# Patient Record
Sex: Female | Born: 1990 | Hispanic: Yes | Marital: Single | Smoking: Never smoker
Health system: Southern US, Community
[De-identification: ages and names within clinical notes are randomized; demographics above are authoritative.]

## PROBLEM LIST (undated history)

## (undated) DIAGNOSIS — E079 Disorder of thyroid, unspecified: Secondary | ICD-10-CM

## (undated) HISTORY — PX: APPENDECTOMY: SHX54

---
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
## 2013-04-16 ENCOUNTER — Other Ambulatory Visit: Payer: Self-pay | Admitting: *Deleted

## 2013-04-22 ENCOUNTER — Other Ambulatory Visit: Payer: Medicaid Other

## 2013-04-24 ENCOUNTER — Ambulatory Visit: Payer: Medicaid Other | Admitting: Endocrinology

## 2013-05-09 ENCOUNTER — Other Ambulatory Visit: Payer: Self-pay | Admitting: Emergency Medicine

## 2013-05-09 DIAGNOSIS — R102 Pelvic and perineal pain: Secondary | ICD-10-CM

## 2013-05-10 ENCOUNTER — Ambulatory Visit
Admission: RE | Admit: 2013-05-10 | Discharge: 2013-05-10 | Disposition: A | Discharge status: Home / Self Care | Payer: BC Managed Care – PPO | Source: Ambulatory Visit | Attending: Emergency Medicine | Admitting: Emergency Medicine

## 2013-05-10 DIAGNOSIS — R102 Pelvic and perineal pain: Secondary | ICD-10-CM

## 2013-05-30 ENCOUNTER — Other Ambulatory Visit: Payer: Self-pay | Admitting: *Deleted

## 2013-05-31 ENCOUNTER — Telehealth: Payer: Self-pay | Admitting: Endocrinology

## 2013-05-31 NOTE — Telephone Encounter (Signed)
I let the patient know, she saw you at the old office and says she's seen you for about a year.  She has not filled out a medical release form yet. I will put one in the mail for her.

## 2013-05-31 NOTE — Telephone Encounter (Signed)
I do not see any medications from me, she should contact health Department or her primary care physician

## 2013-05-31 NOTE — Telephone Encounter (Signed)
Pt has not been seen here, she's requesting refills of her medicine which I denied, she said she can't afford an OV and wants to know what to do about her meds, please advise?

## 2014-09-29 ENCOUNTER — Ambulatory Visit (INDEPENDENT_AMBULATORY_CARE_PROVIDER_SITE_OTHER): Payer: Managed Care, Other (non HMO) | Admitting: Family Medicine

## 2014-09-29 VITALS — BP 102/62 | HR 83 | Temp 98.3°F | Resp 16 | Ht 62.0 in | Wt 126.0 lb

## 2014-09-29 DIAGNOSIS — M778 Other enthesopathies, not elsewhere classified: Secondary | ICD-10-CM

## 2014-09-29 DIAGNOSIS — N920 Excessive and frequent menstruation with regular cycle: Secondary | ICD-10-CM

## 2014-09-29 DIAGNOSIS — N898 Other specified noninflammatory disorders of vagina: Secondary | ICD-10-CM

## 2014-09-29 MED ORDER — NORETHINDRONE ACET-ETHINYL EST 1-20 MG-MCG PO TABS: 1.0000 | ORAL_TABLET | Freq: Every day | ORAL | Status: DC

## 2014-09-29 MED ORDER — PREDNISONE 20 MG PO TABS: 40.0000 mg | ORAL_TABLET | Freq: Every day | ORAL | Status: DC

## 2014-09-29 NOTE — Progress Notes (Signed)
24 yo woman who is right handed and works at [employer]. Over the last month, patient's developed dorsal wrist pain on the right. She's bought a splint but that hasn't helped much. She's also tried ibuprofen which is also not helping.  The pain is mainly in the middle of the dorsal wrist. She does have some soreness on the radial styloid.   She also has persistent spotting for two months. She's currently taking Depo-Medrol shots in her most recent was this month. She started bleeding in the early part of December. She's had the same partner.  Patient denies any cramps or heavy flow.  Objective: Patient has full range of motion of the right wrist. There is no bony mallet ear swelling. She does have some tenderness in the dorsal mid middle aspect of the right wrist with full range of motion but tenderness with forced volar flexion.  This chart was scribed in my presence and reviewed by me personally.    ICD-9-CM ICD-10-CM   1. Right wrist tendonitis 727.05 M77.8 predniSONE (DELTASONE) 20 MG tablet  2. Spotting 623.8 N89.8 GC/chlamydia probe amp, urine     norethindrone-ethinyl estradiol (LOESTRIN 1/20, 21,) 1-20 MG-MCG tablet     Signed, Elvina SidleKurt Emmerich Cryer, MD

## 2014-10-01 LAB — GC/CHLAMYDIA PROBE AMP, URINE
Chlamydia, Swab/Urine, PCR: NEGATIVE
GC Probe Amp, Urine: NEGATIVE

## 2015-05-19 ENCOUNTER — Ambulatory Visit (INDEPENDENT_AMBULATORY_CARE_PROVIDER_SITE_OTHER): Payer: Managed Care, Other (non HMO) | Admitting: Urgent Care

## 2015-05-19 VITALS — BP 116/68 | Temp 98.1°F | Resp 17 | Ht 61.0 in | Wt 125.0 lb

## 2015-05-19 DIAGNOSIS — B373 Candidiasis of vulva and vagina: Secondary | ICD-10-CM | POA: Diagnosis not present

## 2015-05-19 DIAGNOSIS — L298 Other pruritus: Secondary | ICD-10-CM | POA: Diagnosis not present

## 2015-05-19 DIAGNOSIS — B3731 Acute candidiasis of vulva and vagina: Secondary | ICD-10-CM | POA: Insufficient documentation

## 2015-05-19 DIAGNOSIS — R300 Dysuria: Secondary | ICD-10-CM

## 2015-05-19 DIAGNOSIS — N898 Other specified noninflammatory disorders of vagina: Secondary | ICD-10-CM

## 2015-05-19 LAB — POCT URINALYSIS DIPSTICK
BILIRUBIN UA: NEGATIVE
GLUCOSE UA: NEGATIVE
Ketones, UA: NEGATIVE
NITRITE UA: NEGATIVE
Protein, UA: NEGATIVE
RBC UA: NEGATIVE
Spec Grav, UA: 1.015
UROBILINOGEN UA: 0.2
pH, UA: 7

## 2015-05-19 LAB — POCT WET PREP WITH KOH
KOH Prep POC: POSITIVE
TRICHOMONAS UA: NEGATIVE
Yeast Wet Prep HPF POC: POSITIVE

## 2015-05-19 LAB — POCT UA - MICROSCOPIC ONLY
BACTERIA, U MICROSCOPIC: NEGATIVE
CASTS, UR, LPF, POC: NEGATIVE
Crystals, Ur, HPF, POC: NEGATIVE
MUCUS UA: NEGATIVE
Yeast, UA: NEGATIVE

## 2015-05-19 LAB — POCT URINE PREGNANCY: PREG TEST UR: NEGATIVE

## 2015-05-19 MED ORDER — FLUCONAZOLE 150 MG PO TABS: 150.0000 mg | ORAL_TABLET | Freq: Once | ORAL | Status: DC

## 2015-05-19 NOTE — Patient Instructions (Signed)

## 2015-05-19 NOTE — Progress Notes (Signed)
MRN: [id number] DOB: 10/07/1990  Subjective:   Belinda Snyder is a 24 y.o. female presenting for chief complaint of Dysuria and vaginal itching  Reports 1.5 week history of painful urination, malodorous urine, vaginal irritation, genital rash, yellow vaginal discharge. Patient admits that she does have more genital itching after intercourse with use of latex condoms. She did go to a [facility] mobile clinic on 05/16/2015, had STI testing completed but results are not available until Friday 05/22/2015. Has also been treated in the past for BV. Has tried Vagisil wipes with minimal relief. Denies fever, pelvic pain, n/v, abdominal pain, hematuria, urinary frequency. Denies any other aggravating or relieving factors, no other questions or concerns.  Belinda Snyder has a current medication list which includes the following prescription(s): etonogestrel. Also has No Known Allergies.  Belinda Snyder  has no past medical history on file. Also  has past surgical history that includes Appendectomy.  Objective:   Vitals: BP 116/68 mmHg  Temp(Src) 98.1 F (36.7 C) (Oral)  Resp 17  Ht 5\' 1"  (1.549 m)  Wt 125 lb (56.7 kg)  BMI 23.63 kg/m2  LMP 04/10/2015  Physical Exam  Constitutional: She is oriented to person, place, and time. She appears well-developed and well-nourished.  Cardiovascular: Normal rate.   Pulmonary/Chest: Effort normal.  Genitourinary: No labial fusion. There is no rash, tenderness, lesion or injury on the right labia. There is no rash, tenderness, lesion or injury on the left labia.  Neurological: She is alert and oriented to person, place, and time.  Skin: Skin is warm and dry. No rash noted. No erythema. No pallor.  Psychiatric: She has a normal mood and affect.   Results for orders placed or performed in visit on 05/19/15 (from the past 24 hour(s))  POCT UA - Microscopic Only     Status: None   Collection Time: 05/19/15  7:57 PM  Result Value Ref Range   WBC, Ur,  HPF, POC 6-10    RBC, urine, microscopic 0-2    Bacteria, U Microscopic neg    Mucus, UA neg    Epithelial cells, urine per micros 2-5    Crystals, Ur, HPF, POC neg    Casts, Ur, LPF, POC neg    Yeast, UA neg   POCT urinalysis dipstick     Status: Abnormal   Collection Time: 05/19/15  7:57 PM  Result Value Ref Range   Color, UA yellow    Clarity, UA clear    Glucose, UA neg    Bilirubin, UA neg    Ketones, UA neg    Spec Grav, UA 1.015    Blood, UA neg    pH, UA 7.0    Protein, UA neg    Urobilinogen, UA 0.2    Nitrite, UA neg    Leukocytes, UA large (3+) (A) Negative  POCT Wet Prep with KOH     Status: None   Collection Time: 05/19/15  8:24 PM  Result Value Ref Range   Trichomonas, UA Negative    Clue Cells Wet Prep HPF POC 0-2    Epithelial Wet Prep HPF POC Many Few, Moderate, Many   Yeast Wet Prep HPF POC positive    Bacteria Wet Prep HPF POC Few None, Few   RBC Wet Prep HPF POC 0-3    WBC Wet Prep HPF POC 2-4    KOH Prep POC Positive   POCT urine pregnancy     Status: None   Collection Time: 05/19/15  8:25 PM  Result Value Ref Range   Preg Test, Ur Negative Negative   Assessment and Plan :   1. Dysuria 2. Vaginal itching 3. Vaginal discharge 4. Yeast vaginitis - Start diflucan for her yeast vaginitis. Patient states that she tested negative for HIV recently. I recommended that she make sure she follow up with her other is to have results on Friday with the [facility] mobile clinic. She is to followup in one week if no improvement in her symptoms.  Wallis Bamberg, PA-C [facility] [phone] 05/19/2015 7:49 PM

## 2015-06-29 ENCOUNTER — Ambulatory Visit (INDEPENDENT_AMBULATORY_CARE_PROVIDER_SITE_OTHER): Payer: Managed Care, Other (non HMO) | Admitting: Internal Medicine

## 2015-06-29 VITALS — BP 100/68 | HR 73 | Temp 98.1°F | Resp 16 | Ht 61.0 in | Wt 125.8 lb

## 2015-06-29 DIAGNOSIS — I862 Pelvic varices: Secondary | ICD-10-CM | POA: Diagnosis not present

## 2015-06-29 DIAGNOSIS — N898 Other specified noninflammatory disorders of vagina: Secondary | ICD-10-CM | POA: Diagnosis not present

## 2015-06-29 DIAGNOSIS — N941 Unspecified dyspareunia: Secondary | ICD-10-CM

## 2015-06-29 DIAGNOSIS — R1084 Generalized abdominal pain: Secondary | ICD-10-CM | POA: Diagnosis not present

## 2015-06-29 LAB — POCT URINALYSIS DIP (MANUAL ENTRY)
BILIRUBIN UA: NEGATIVE
Blood, UA: NEGATIVE
Glucose, UA: NEGATIVE
LEUKOCYTES UA: NEGATIVE
Nitrite, UA: NEGATIVE
PH UA: 5
Protein Ur, POC: NEGATIVE
SPEC GRAV UA: 1.015
Urobilinogen, UA: 0.2

## 2015-06-29 LAB — POC MICROSCOPIC URINALYSIS (UMFC): Mucus: ABSENT

## 2015-06-29 LAB — POCT WET + KOH PREP
TRICH BY WET PREP: ABSENT
Yeast by KOH: ABSENT
Yeast by wet prep: ABSENT

## 2015-06-29 MED ORDER — FLUCONAZOLE 150 MG PO TABS: 150.0000 mg | ORAL_TABLET | Freq: Once | ORAL | Status: DC

## 2015-06-29 MED ORDER — METRONIDAZOLE 500 MG PO TABS: 500.0000 mg | ORAL_TABLET | Freq: Two times a day (BID) | ORAL | Status: DC

## 2015-06-29 NOTE — Progress Notes (Addendum)
Subjective:  This chart was scribed for Ellamae Sia, MD by [facility], medical scribe at [facility].The patient was seen in exam room 14 and the patient's care was started at 8:38 PM.   Patient ID: Belinda Snyder, female    DOB: 01/24/1991, 24 y.o.   MRN: [id number] Chief Complaint  Patient presents with  . Follow-up    vaginal discharge with odor  . Abdominal Pain    HPI HPI Comments: Belinda Snyder is a 24 y.o. female who presents to [facility] complaining of pelvic pain, vaginal odor and discharge. No dysuria, and urinary frequency. Having pain with intercourse, described as deep in the abdomen. Some dryness during intercourse. On nexplanon, She did have a menstrual period this past month. In a 5 year relationship though this is not continuous and has been on and off for the past 9 month. There may of been outside partners. Tested for gonorrhea and chlamydia, which was negative early September 2016 reportedly/this is not in the chart.   Has issues with constipation. No nausea or vomiting.   Her pelvic discomfort is actually intermittent over the last year or more as is her dyspareunia. All the important note is her pelvic ultrasound results indicating ovarian varices. This has not been investigated further.  History reviewed. No pertinent past medical history. Prior to Admission medications   Medication Sig Start Date End Date Taking? Authorizing Provider  etonogestrel (NEXPLANON) 68 MG IMPL implant 1 each by Subdermal route once.   Yes Historical Provider, MD  fluconazole (DIFLUCAN) 150 MG tablet Take 1 tablet (150 mg total) by mouth once. Repeat 72 hours later. Patient not taking: Reported on 06/29/2015 05/19/15   Wallis Bamberg, PA-C   No Known Allergies   Review of Systems  Genitourinary: Positive for vaginal discharge and pelvic pain. Negative for dysuria, frequency and vaginal bleeding.  chronically  constipated but rare pain     Objective:  BP 100/68 mmHg  Pulse 73  Temp(Src) 98.1 F (36.7 C) (Oral)  Resp 16  Ht  (1.549 m)  Wt 125 lb 12.8 oz (57.063 kg)  BMI 23.78 kg/m2  SpO2 98%  LMP 06/10/2015 Physical Exam  Constitutional: She is oriented to person, place, and time. She appears well-developed and well-nourished. No distress.  HENT:  Head: Normocephalic and atraumatic.  Eyes: Pupils are equal, round, and reactive to light.  Neck: Normal range of motion.  Cardiovascular: Normal rate and regular rhythm.   Pulmonary/Chest: Effort normal. No respiratory distress.  Abdominal:  Mild tenderness in right and left lower quadrants without suprapubic tenderness. No masses. No rebound or guarding  Genitourinary:  The introitus is clear without inguinal adenopathy Vault is clear except white discharge Os clear without friability Uterus mid position and nontender No adnexal masses or tenderness Palpable stool in the colon that is tender  Musculoskeletal: Normal range of motion.  Neurological: She is alert and oriented to person, place, and time.  Skin: Skin is warm and dry.  Psychiatric: She has a normal mood and affect. Her behavior is normal.  Nursing note and vitals reviewed. BP 100/68 mmHg  Pulse 73  Temp(Src) 98.1 F (36.7 C) (Oral)  Resp 16  Ht  (1.549 m)  Wt 125 lb 12.8 oz (57.063 kg)  BMI 23.78 kg/m2  SpO2 98%  LMP 06/10/2015  Results for orders placed or performed in visit on 06/29/15  POCT urinalysis dipstick  Result Value Ref Range   Color,  UA yellow yellow   Clarity, UA hazy (A) clear   Glucose, UA negative negative   Bilirubin, UA negative negative   Ketones, POC UA small (15) (A) negative   Spec Grav, UA 1.015    Blood, UA negative negative   pH, UA 5.0    Protein Ur, POC negative negative   Urobilinogen, UA 0.2    Nitrite, UA Negative Negative   Leukocytes, UA Negative Negative  POCT Microscopic Urinalysis ([facility])  Result Value Ref Range    WBC,UR,HPF,POC Few (A) None WBC/hpf   RBC,UR,HPF,POC None None RBC/hpf   Bacteria Few (A) None, Too numerous to count   Mucus Absent Absent   Epithelial Cells, UR Per Microscopy Few (A) None, Too numerous to count cells/hpf  POCT Wet + KOH Prep  Result Value Ref Range   Yeast by KOH Absent Present, Absent   Yeast by wet prep Absent Present, Absent   WBC by wet prep Few None, Few, Too numerous to count   Clue Cells Wet Prep HPF POC Moderate (A) None, Too numerous to count   Trich by wet prep Absent Present, Absent   Bacteria Wet Prep HPF POC Many (A) None, Few, Too numerous to count   Epithelial Cells By [employer]et Pref ([facility]) Many (A) None, Few, Too numerous to count   RBC,UR,HPF,POC None None RBC/hpf       Assessment & Plan:  Vaginal discharge -secondary to bacterial vaginosis   Lower abdominal pain - ? Secondary to constipation versus #3  Pelvic varices - Plan: Ambulatory referral to Gynecology for follow-up evaluation of pelvic ultrasound  Dyspareunia in female - Plan: Ambulatory referral to Gynecology  Meds ordered this encounter  Medications  . fluconazole (DIFLUCAN) 150 MG tablet if needed following Flagyl     Sig: Take 1 tablet (150 mg total) by mouth once.    Dispense:  1 tablet    Refill:  0  . metroNIDAZOLE (FLAGYL) 500 MG tablet    Sig: Take 1 tablet (500 mg total) by mouth 2 (two) times daily.    Dispense:  14 tablet    Refill:  0   Repeated Gen-Probe   By signing my name below, I, Nadim Abuhashem, attest that this documentation has been prepared under the direction and in the presence of Ellamae Siaobert Robi Mitter, MD.  Electronically Signed: Conchita ParisNadim Abuhashem, medical scribe. 06/29/2015, 8:45 PM.    Addend 10/25 miralax twice a day til soft to remove any possibility that pain is intestinal

## 2015-06-30 ENCOUNTER — Telehealth: Payer: Self-pay

## 2015-06-30 MED ORDER — POLYETHYLENE GLYCOL 3350 17 GM/SCOOP PO POWD: 17.0000 g | Freq: Two times a day (BID) | ORAL | Status: DC | PRN

## 2015-06-30 NOTE — Telephone Encounter (Signed)
Meds ordered this encounter  Medications  . polyethylene glycol powder (GLYCOLAX/MIRALAX) powder    Sig: Take 17 g by mouth 2 (two) times daily as needed. Until stools soft and constipation resolved    Dispense:  225 g    Refill:  1

## 2015-06-30 NOTE — Telephone Encounter (Signed)
Patient Returned call - relayed message, she will go by the pharmacy tonight after work.  Thank you so much.

## 2015-06-30 NOTE — Telephone Encounter (Signed)
Dr. Merla Richesoolittle did you want to Rx something for stomach?

## 2015-06-30 NOTE — Telephone Encounter (Signed)
Called pt, unable to leave message.

## 2015-06-30 NOTE — Telephone Encounter (Signed)
Pt is needing to get the rx regarding her stomach redone -they state they did not get that last night

## 2015-07-01 LAB — GC/CHLAMYDIA PROBE AMP
CT PROBE, AMP APTIMA: NEGATIVE
GC Probe RNA: NEGATIVE

## 2015-08-28 ENCOUNTER — Ambulatory Visit (INDEPENDENT_AMBULATORY_CARE_PROVIDER_SITE_OTHER): Payer: Managed Care, Other (non HMO) | Admitting: Family Medicine

## 2015-08-28 VITALS — BP 116/68 | HR 76 | Temp 98.5°F | Resp 17 | Ht 61.0 in | Wt 124.0 lb

## 2015-08-28 DIAGNOSIS — N898 Other specified noninflammatory disorders of vagina: Secondary | ICD-10-CM | POA: Diagnosis not present

## 2015-08-28 DIAGNOSIS — B9689 Other specified bacterial agents as the cause of diseases classified elsewhere: Secondary | ICD-10-CM

## 2015-08-28 DIAGNOSIS — N760 Acute vaginitis: Secondary | ICD-10-CM | POA: Diagnosis not present

## 2015-08-28 DIAGNOSIS — A499 Bacterial infection, unspecified: Secondary | ICD-10-CM

## 2015-08-28 DIAGNOSIS — N921 Excessive and frequent menstruation with irregular cycle: Secondary | ICD-10-CM | POA: Diagnosis not present

## 2015-08-28 LAB — POCT WET + KOH PREP
TRICH BY WET PREP: ABSENT
YEAST BY KOH: ABSENT
YEAST BY WET PREP: ABSENT

## 2015-08-28 MED ORDER — LEVONORGESTREL-ETHINYL ESTRAD 0.1-20 MG-MCG PO TABS: 1.0000 | ORAL_TABLET | Freq: Every day | ORAL | Status: DC

## 2015-08-28 MED ORDER — TINIDAZOLE 500 MG PO TABS: 2.0000 g | ORAL_TABLET | Freq: Every day | ORAL | Status: DC

## 2015-08-28 MED ORDER — FLUCONAZOLE 150 MG PO TABS: 150.0000 mg | ORAL_TABLET | Freq: Once | ORAL | Status: DC

## 2015-08-28 NOTE — Patient Instructions (Signed)

## 2015-08-28 NOTE — Progress Notes (Addendum)
Subjective:  By signing my name below, I, Belinda Snyder, attest that this documentation has been prepared under the direction and in the presence of Belinda SorensonEva Shaw, MD.  Broadus Johnawaa Al Snyder, Medical Scribe. 08/28/2015.  4:15 PM.    Patient ID: Belinda Snyder, female    DOB: 03/28/1991, 24 y.o.   MRN: [id number]  Chief Complaint  Patient presents with  . Vaginal Discharge   HPI HPI Comments: Belinda Snyder is a 24 y.o. female who presents to [facility] complaining of abnormal vaginal discharge.  Pt is on Nexplanon. She had frequent episode of vaginitis. She was seen 3 months ago, and then again 2 months ago with bacterial vaginosis, and yeast infection. She was referred to gynecology, made an appointment [facility] last month. Pt has followed up with them, and indicates that she was put on birth control pills in addition to her implant. She states that she stopped taking the pills after a month however due to her insurance stopped paying for it.   Pt reports that she is presenting with similar symptoms as her previous BV. She states that her symptoms have been intermittent during the past 3 months. She reports symptoms of abnormal odor. She denies increased frequency that is different than baseline, having increased urgency at night time, or any urinary or bowel complications. Pt is sexually active, and is not concerned about being pregnant.   Pt indicates that she has been menstruating intermittently for the past 3 weeks. She notes having associated symptoms of headaches and fatigue. She states that she leads a healthy lifestyle with health diet and exercising regularly.   Patient Active Problem List   Diagnosis Date Noted  . 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 Yeast vaginitis 05/19/2015   No past medical history on file. Past Surgical History  Procedure Laterality Date  . Appendectomy     No Known Allergies Prior to Admission medications     Medication Sig Start Date End Date Taking? Authorizing Provider  etonogestrel (NEXPLANON) 68 MG IMPL implant 1 each by Subdermal route once.   Yes Historical Provider, MD   Social History   Social History  . Marital Status: Single    Spouse Name: N/A  . Number of Children: N/A  . Years of Education: N/A   Occupational History  . Not on file.   Social History Main Topics  . Smoking status: Never Smoker   . Smokeless tobacco: Not on file  . Alcohol Use: Yes     Comment: occassinally  . Drug Use: No  . Sexual Activity: Not on file   Other Topics Concern  . Not on file   Social History Narrative    Review of Systems  Constitutional: Positive for fatigue.  Gastrointestinal: Negative for diarrhea and constipation.  Genitourinary: Positive for vaginal discharge and menstrual problem. Negative for frequency.  Neurological: Positive for headaches.      Objective:   Physical Exam  Constitutional: She is oriented to person, place, and time. She appears well-developed and well-nourished. No distress.  HENT:  Head: Normocephalic and atraumatic.  Eyes: EOM are normal. Pupils are equal, round, and reactive to light.  Neck: Neck supple.  Cardiovascular: Normal rate.   Pulmonary/Chest: Effort normal.  Genitourinary:  Normal labia major and minora. Moderate amount of thin white vaginal discharge. Cervix appears nl. No CMT. Mild uterus tenderness to palp.  No enlargement or fixation. Normal adnexa.   Neurological: She is alert and oriented to person, place,  and time. No cranial nerve deficit.  Skin: Skin is warm and dry.  Psychiatric: She has a normal mood and affect. Her behavior is normal.  Nursing note and vitals reviewed.   BP 116/68 mmHg  Pulse 76  Temp(Src) 98.5 F (36.9 C) (Oral)  Resp 17  Ht  (1.549 m)  Wt 124 lb (56.246 kg)  BMI 23.44 kg/m2  SpO2 97%  LMP 08/28/2015  Results for orders placed or performed in visit on 08/28/15  POCT Wet + KOH Prep  Result  Value Ref Range   Yeast by KOH Absent Present, Absent   Yeast by wet prep Absent Present, Absent   WBC by wet prep Few None, Few, Too numerous to count   Clue Cells Wet Prep HPF POC Many (A) None, Too numerous to count   Trich by wet prep Absent Present, Absent   Bacteria Wet Prep HPF POC Many (A) None, Few, Too numerous to count   Epithelial Cells By [employer] Pref ([employer]) Many (A) None, Few, Too numerous to count   RBC,UR,HPF,POC None None RBC/hpf      Assessment & Plan:   1. Vaginal discharge   2. Metrorrhagia  - likely due coming off OCPs - pt has seen gyn and was just started on OCPs 2 mos prior but could not afford to cont them - will try similar ocp of different brand/generic. Recheck w/ gyn if sxs cont.  3. Bacterial vaginosis   Reviewed potential BV triggers in detail to decrease freq. If sxs cont to recur, f/u here - ok to do self-collect wet prep - as if has another 1-2 episodes this yr would be worth consider 6 mos of treatment and/or prophylactic trx.   Orders Placed This Encounter  Procedures  . POCT Wet + KOH Prep    Meds ordered this encounter  Medications  . tinidazole (TINDAMAX) 500 MG tablet    Sig: Take 4 tablets (2,000 mg total) by mouth daily with breakfast.    Dispense:  8 tablet    Refill:  0  . fluconazole (DIFLUCAN) 150 MG tablet    Sig: Take 1 tablet (150 mg total) by mouth once. After antibiotic course is complete    Dispense:  1 tablet    Refill:  0  . levonorgestrel-ethinyl estradiol (AVIANE,ALESSE,LESSINA) 0.1-20 MG-MCG tablet    Sig: Take 1 tablet by mouth daily.    Dispense:  1 Package    Refill:  11    I personally performed the services described in this documentation, which was scribed in my presence. The recorded information has been reviewed and considered, and addended by me as needed.  Belinda Sorenson, MD MPH

## 2015-10-06 ENCOUNTER — Ambulatory Visit (INDEPENDENT_AMBULATORY_CARE_PROVIDER_SITE_OTHER): Payer: Managed Care, Other (non HMO) | Admitting: Family Medicine

## 2015-10-06 VITALS — BP 110/72 | HR 76 | Temp 98.2°F | Resp 18 | Ht 61.0 in | Wt 123.4 lb

## 2015-10-06 DIAGNOSIS — H66001 Acute suppurative otitis media without spontaneous rupture of ear drum, right ear: Secondary | ICD-10-CM | POA: Diagnosis not present

## 2015-10-06 MED ORDER — AMOXICILLIN-POT CLAVULANATE 875-125 MG PO TABS: 1.0000 | ORAL_TABLET | Freq: Two times a day (BID) | ORAL | Status: DC
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
## 2015-10-06 NOTE — Patient Instructions (Addendum)
Afrin-2 sprays in each nostril twice a day for 3 days. Sudafed one in the morning and one in the afternoon for congestion as needed. Continue Ibuprofen 2-3 tablets, two to three times a day. Drink enough liquid to make your urine light yellow.

## 2015-10-07 ENCOUNTER — Encounter: Payer: Self-pay | Admitting: Family Medicine

## 2015-12-14 ENCOUNTER — Ambulatory Visit (INDEPENDENT_AMBULATORY_CARE_PROVIDER_SITE_OTHER): Payer: Managed Care, Other (non HMO) | Admitting: Internal Medicine

## 2015-12-14 VITALS — BP 120/78 | HR 82 | Temp 98.2°F | Resp 17 | Ht 63.0 in | Wt 128.0 lb

## 2015-12-14 DIAGNOSIS — N760 Acute vaginitis: Secondary | ICD-10-CM | POA: Diagnosis not present

## 2015-12-14 DIAGNOSIS — E039 Hypothyroidism, unspecified: Secondary | ICD-10-CM | POA: Diagnosis not present

## 2015-12-14 DIAGNOSIS — J209 Acute bronchitis, unspecified: Secondary | ICD-10-CM | POA: Diagnosis not present

## 2015-12-14 LAB — POCT CBC
GRANULOCYTE PERCENT: 40.9 % (ref 37–80)
HEMATOCRIT: 37.4 % — AB (ref 37.7–47.9)
Hemoglobin: 12.8 g/dL (ref 12.2–16.2)
LYMPH, POC: 2.7 (ref 0.6–3.4)
MCH, POC: 29.6 pg (ref 27–31.2)
MCHC: 34.2 g/dL (ref 31.8–35.4)
MCV: 86.5 fL (ref 80–97)
MID (cbc): 0.3 (ref 0–0.9)
MPV: 6.4 fL (ref 0–99.8)
POC GRANULOCYTE: 2 (ref 2–6.9)
POC LYMPH %: 54 % — AB (ref 10–50)
POC MID %: 5.1 % (ref 0–12)
Platelet Count, POC: 312 10*3/uL (ref 142–424)
RBC: 4.33 M/uL (ref 4.04–5.48)
RDW, POC: 14.5 %
WBC: 5 10*3/uL (ref 4.6–10.2)

## 2015-12-14 LAB — POCT WET + KOH PREP
TRICH BY WET PREP: ABSENT
YEAST BY KOH: ABSENT
Yeast by wet prep: ABSENT

## 2015-12-14 MED ORDER — BORIC ACID TOPICAL POWD: 600.0000 mg | Freq: Every evening | Status: DC | PRN

## 2015-12-14 MED ORDER — AZITHROMYCIN 250 MG PO TABS: ORAL_TABLET | ORAL | Status: DC

## 2015-12-14 MED ORDER — METRONIDAZOLE 0.75 % VA GEL: VAGINAL | Status: DC

## 2015-12-14 MED ORDER — METRONIDAZOLE 500 MG PO TABS: 500.0000 mg | ORAL_TABLET | Freq: Two times a day (BID) | ORAL | Status: DC

## 2015-12-14 NOTE — Patient Instructions (Addendum)
  Results can be improved by adding vaginal boric acid to the oral nitroimidazole induction therapy Metronidazole is taken orally for seven days; vaginal boric acid 600 mg once daily at bedtime is begun at the same time and continued for 21 days Patients are seen for follow-up a day or two after their last boric acid dose; if they are in remission, we immediately begin metronidazole gel twice weekly for four to six months as suppressive therapy. Therapy is then discontinued   IF you received an x-ray today, you will receive an invoice from [facility]. Please contact [facility] at [phone] with questions or concerns regarding your invoice.   IF you received labwork today, you will receive an invoice from [facility]. Please contact [facility] at [phone] with questions or concerns regarding your invoice.   Our billing staff will not be able to assist you with questions regarding bills from these companies.  You will be contacted with the lab results as soon as they are available. The fastest way to get your results is to activate your My Chart account. Instructions are located on the last page of this paperwork. If you have not heard from us regarding the results in 2 weeks, please contact this office.

## 2015-12-14 NOTE — Progress Notes (Signed)
Subjective:  By signing my name below, I, Stann Ore, attest that this documentation has been prepared under the direction and in the presence of Ellamae Sia, MD. Electronically Signed: Stann Ore, Scribe. 12/14/2015 , 4:40 PM .  Patient was seen in Room 11 .   Patient ID: Belinda Snyder, female    DOB: [birth year], 25 y.o.   MRN: [id number] Chief Complaint  Patient presents with  . URI    recently dx with Bronchitis seen at [facility]  . Vaginal Itching    still on menstrual cycle  . blood work    would like blood work done to check for Thyroid    HPI Belinda Snyder is a 25 y.o. female who presents to [facility] complaining of a persistent cough ongoing for a week. She was last treated for URI towards the end of February, and was feeling better; now symptoms have returned. She was recently diagnosed with bronchitis at [facility], but she's still coughing. She denies any known history of asthma. She also notes having sinus infection that started around the same time as her bronchitis.   Patient has noticed BV after each menstrual cycle for several months despite treatment. She's tried using gel and probiotics for temporary relief. She's been advised to change soaps without relief. She uses pads and not tampons. She denies any new sexual partners since January. She informs her last intercourse was 2 months ago. She's still on nexplanon.   Her mother was recently diagnosed with thyroid cancer and would like bloodwork done. She used to be on medications for hypothyroidism, but hasn't been taking them due to side effects.   Patient Active Problem List   Diagnosis Date Noted  . 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 Yeast vaginitis 05/19/2015    Current outpatient prescriptions:  .  etonogestrel (NEXPLANON) 68 MG IMPL implant, 1 each by Subdermal route once., Disp: , Rfl:   Review of Systems  Constitutional: Negative for fever, chills and fatigue.  HENT: Positive for  rhinorrhea and sinus pressure. Negative for congestion.   Respiratory: Positive for cough. Negative for shortness of breath and wheezing.   Gastrointestinal: Negative for nausea, vomiting and diarrhea.  Genitourinary: Positive for genital sores. Negative for menstrual problem.       Objective:   Physical Exam  Constitutional: She is oriented to person, place, and time. She appears well-developed and well-nourished. No distress.  HENT:  Head: Normocephalic and atraumatic.  Mouth/Throat: Oropharynx is clear and moist.  Glistening turbinates  Eyes: EOM are normal. Pupils are equal, round, and reactive to light.  Neck: Neck supple. No thyromegaly present.  Cardiovascular: Normal rate.   Pulmonary/Chest: Effort normal. No respiratory distress. She has rhonchi.  Scattered rhonchi, but cleared with deep breathing  Musculoskeletal: Normal range of motion.  Lymphadenopathy:    She has no cervical adenopathy.  Neurological: She is alert and oriented to person, place, and time.  Skin: Skin is warm and dry.  Psychiatric: She has a normal mood and affect. Her behavior is normal.  Nursing note and vitals reviewed.   BP 120/78 mmHg  Pulse 82  Temp(Src) 98.2 F (36.8 C) (Oral)  Resp 17  Ht  (1.6 m)  Wt 128 lb (58.06 kg)  BMI 22.68 kg/m2  SpO2 97%  LMP 12/03/2015   Results for orders placed or performed in visit on 12/14/15  POCT CBC  Result Value Ref Range   WBC 5.0 4.6 - 10.2 K/uL   Lymph, poc 2.7 0.6 - 3.4  POC LYMPH PERCENT 54.0 (A) 10 - 50 %L   MID (cbc) 0.3 0 - 0.9   POC MID % 5.1 0 - 12 %M   POC Granulocyte 2.0 2 - 6.9   Granulocyte percent 40.9 37 - 80 %G   RBC 4.33 4.04 - 5.48 M/uL   Hemoglobin 12.8 12.2 - 16.2 g/dL   HCT, POC 47.437.4 (A) 25.937.7 - 47.9 %   MCV 86.5 80 - 97 fL   MCH, POC 29.6 27 - 31.2 pg   MCHC 34.2 31.8 - 35.4 g/dL   RDW, POC 56.314.5 %   Platelet Count, POC 312 142 - 424 K/uL   MPV 6.4 0 - 99.8 fL  POCT Wet + KOH Prep  Result Value Ref Range   Yeast  by KOH Absent Present, Absent   Yeast by wet prep Absent Present, Absent   WBC by wet prep Few None, Few, Too numerous to count   Clue Cells Wet Prep HPF POC None None, Too numerous to count   Trich by wet prep Absent Present, Absent   Bacteria Wet Prep HPF POC Many (A) None, Few, Too numerous to count   Epithelial Cells By [employer] Pref ([employer]) Moderate (A) None, Few, Too numerous to count   RBC,UR,HPF,POC None None RBC/hpf      Assessment & Plan:   I have completed the patient encounter in its entirety as documented by the scribe, with editing by me where necessary. Belinda Snyder P. Merla Richesoolittle, M.D.  Recurrent vaginitis - Plan: POCT Wet + KOH Prep  Hypothyroidism, unspecified hypothyroidism type - Plan: POCT CBC, TSH  Acute bronchitis, unspecified organism  Meds ordered this encounter  Medications  . metroNIDAZOLE (FLAGYL) 500 MG tablet    Sig: Take 1 tablet (500 mg total) by mouth 2 (two) times daily.    Dispense:  14 tablet    Refill:  0  . Boric Acid Topical POWD    Sig: Place 600 mg vaginally at bedtime as needed. Make into capsules for intravaginal use for 21 days in a row    Dispense:  12.6 g    Refill:  0  . metroNIDAZOLE (METROGEL) 0.75 % vaginal gel    Sig: Twice weekly for 6 months    Dispense:  70 g    Refill:  10  . azithromycin (ZITHROMAX) 250 MG tablet    Sig: As packaged    Dispense:  6 tablet    Refill:  0

## 2015-12-15 LAB — TSH: TSH: 1.82 m[IU]/L

## 2015-12-29 ENCOUNTER — Encounter (HOSPITAL_COMMUNITY): Payer: Self-pay

## 2015-12-29 ENCOUNTER — Telehealth: Payer: Self-pay | Admitting: *Deleted

## 2015-12-29 ENCOUNTER — Emergency Department (HOSPITAL_COMMUNITY)
Admission: EM | Admit: 2015-12-29 | Discharge: 2015-12-29 | Disposition: A | Discharge status: Home / Self Care | Payer: Managed Care, Other (non HMO) | Attending: Emergency Medicine | Admitting: Emergency Medicine

## 2015-12-29 DIAGNOSIS — R110 Nausea: Secondary | ICD-10-CM | POA: Diagnosis not present

## 2015-12-29 DIAGNOSIS — Z3202 Encounter for pregnancy test, result negative: Secondary | ICD-10-CM | POA: Diagnosis not present

## 2015-12-29 DIAGNOSIS — Z79899 Other long term (current) drug therapy: Secondary | ICD-10-CM | POA: Diagnosis not present

## 2015-12-29 DIAGNOSIS — R1031 Right lower quadrant pain: Secondary | ICD-10-CM | POA: Insufficient documentation

## 2015-12-29 DIAGNOSIS — Z9049 Acquired absence of other specified parts of digestive tract: Secondary | ICD-10-CM | POA: Insufficient documentation

## 2015-12-29 DIAGNOSIS — R1030 Lower abdominal pain, unspecified: Secondary | ICD-10-CM

## 2015-12-29 LAB — COMPREHENSIVE METABOLIC PANEL
ALT: 21 U/L (ref 14–54)
AST: 17 U/L (ref 15–41)
Albumin: 4 g/dL (ref 3.5–5.0)
Alkaline Phosphatase: 44 U/L (ref 38–126)
Anion gap: 10 (ref 5–15)
BUN: 8 mg/dL (ref 6–20)
CO2: 23 mmol/L (ref 22–32)
Calcium: 9.3 mg/dL (ref 8.9–10.3)
Chloride: 107 mmol/L (ref 101–111)
Creatinine, Ser: 0.65 mg/dL (ref 0.44–1.00)
GFR calc Af Amer: >60 mL/min (ref >60)
GFR calc non Af Amer: >60 mL/min (ref >60)
Glucose, Bld: 103 mg/dL — ABNORMAL HIGH (ref 65–99)
Potassium: 4.1 mmol/L (ref 3.5–5.1)
Sodium: 140 mmol/L (ref 135–145)
Total Bilirubin: 0.9 mg/dL (ref 0.3–1.2)
Total Protein: 7.6 g/dL (ref 6.5–8.1)

## 2015-12-29 LAB — URINALYSIS, ROUTINE W REFLEX MICROSCOPIC
Bilirubin Urine: NEGATIVE
Glucose, UA: NEGATIVE mg/dL
Hgb urine dipstick: NEGATIVE
Ketones, ur: NEGATIVE mg/dL
Leukocytes, UA: NEGATIVE
Nitrite: NEGATIVE
Protein, ur: NEGATIVE mg/dL
Specific Gravity, Urine: 1.009 (ref 1.005–1.030)
pH: 7.5 (ref 5.0–8.0)

## 2015-12-29 LAB — CBC WITH DIFFERENTIAL/PLATELET
Basophils Absolute: 0 10*3/uL (ref 0.0–0.1)
Basophils Relative: 0 %
Eosinophils Absolute: 0 10*3/uL (ref 0.0–0.7)
Eosinophils Relative: 0 %
HCT: 37.7 % (ref 36.0–46.0)
Hemoglobin: 13.1 g/dL (ref 12.0–15.0)
Lymphocytes Relative: 34 %
Lymphs Abs: 1.7 10*3/uL (ref 0.7–4.0)
MCH: 29.1 pg (ref 26.0–34.0)
MCHC: 34.7 g/dL (ref 30.0–36.0)
MCV: 83.8 fL (ref 78.0–100.0)
Monocytes Absolute: 0.2 10*3/uL (ref 0.1–1.0)
Monocytes Relative: 4 %
Neutro Abs: 3.2 10*3/uL (ref 1.7–7.7)
Neutrophils Relative %: 62 %
Platelets: 381 10*3/uL (ref 150–400)
RBC: 4.5 MIL/uL (ref 3.87–5.11)
RDW: 13.6 % (ref 11.5–15.5)
WBC: 5.1 10*3/uL (ref 4.0–10.5)

## 2015-12-29 LAB — RPR: RPR Ser Ql: NONREACTIVE

## 2015-12-29 LAB — WET PREP, GENITAL
Clue Cells Wet Prep HPF POC: NONE SEEN
Sperm: NONE SEEN
Trich, Wet Prep: NONE SEEN
Yeast Wet Prep HPF POC: NONE SEEN

## 2015-12-29 LAB — GC/CHLAMYDIA PROBE AMP (~~LOC~~) NOT AT ARMC
Chlamydia: NEGATIVE
Neisseria Gonorrhea: NEGATIVE

## 2015-12-29 LAB — HIV ANTIBODY (ROUTINE TESTING W REFLEX): HIV Screen 4th Generation wRfx: NONREACTIVE

## 2015-12-29 LAB — PREGNANCY, URINE: Preg Test, Ur: NEGATIVE

## 2015-12-29 LAB — LIPASE, BLOOD: Lipase: 20 U/L (ref 11–51)

## 2015-12-29 NOTE — ED Notes (Signed)
Bed: WA23 Expected date:  Expected time:  Means of arrival:  Comments: 

## 2015-12-29 NOTE — Discharge Instructions (Signed)
You may alternate Advil and Tylenol every 3 hours as needed for your pain. Only take the recommended dosage on the package for each. Please follow-up with your primary care provider for further evaluation of your abdominal pain. Please return the emergency Department if your develop any new or worsening symptoms. You will be called in 2-3 days if any of your lab results return POSITIVE.   Abdominal Pain, Adult Many things can cause abdominal pain. Usually, abdominal pain is not caused by a disease and will improve without treatment. It can often be observed and treated at home. Your health care provider will do a physical exam and possibly order blood tests and X-rays to help determine the seriousness of your pain. However, in many cases, more time must pass before a clear cause of the pain can be found. Before that point, your health care provider may not know if you need more testing or further treatment. HOME CARE INSTRUCTIONS Monitor your abdominal pain for any changes. The following actions may help to alleviate any discomfort you are experiencing:  Only take over-the-counter or prescription medicines as directed by your health care provider.  Do not take laxatives unless directed to do so by your health care provider.  Try a clear liquid diet (broth, tea, or water) as directed by your health care provider. Slowly move to a bland diet as tolerated. SEEK MEDICAL CARE IF:  You have unexplained abdominal pain.  You have abdominal pain associated with nausea or diarrhea.  You have pain when you urinate or have a bowel movement.  You experience abdominal pain that wakes you in the night.  You have abdominal pain that is worsened or improved by eating food.  You have abdominal pain that is worsened with eating fatty foods.  You have a fever. SEEK IMMEDIATE MEDICAL CARE IF:  Your pain does not go away within 2 hours.  You keep throwing up (vomiting).  Your pain is felt only in  portions of the abdomen, such as the right side or the left lower portion of the abdomen.  You pass bloody or black tarry stools. MAKE SURE YOU:  Understand these instructions.  Will watch your condition.  Will get help right away if you are not doing well or get worse.   This information is not intended to replace advice given to you by your health care provider. Make sure you discuss any questions you have with your health care provider.   Document Released: 06/01/2005 Document Revised: 05/13/2015 Document Reviewed: 05/01/2013 Elsevier Interactive Patient Education [employer]2016 [employer]

## 2015-12-29 NOTE — ED Provider Notes (Signed)
CSN: [id number]     Arrival date & time 12/29/15  0357 History   First MD Initiated Contact with Patient 12/29/15 [phone]     Chief Complaint  Patient presents with  . Abdominal Pain     (Consider location/radiation/quality/duration/timing/severity/associated sxs/prior Treatment) HPI Comments: Patient is a 25 year old female who presents with suprapubic pain. Patient states she being began experiencing an extreme, stabbing pain around 2 AM. Patient states she couldn't move because of the pain. Patient had associated nausea with the , but no vomiting. Patient took thousand milligrams of Tylenol. She rates her current pain as a 4/10. Patient has been treated with topical boric acid for bacterial vaginosis for the past 2 weeks. She saw taking Flagyl 1 week ago. She denies any discharge or vaginal pain. Patient's LMP April 4. Patient has had normal bowel movements and urination since onset of pain. Patient endorses cough and bronchitis since January. She has been taking medicine for this. Patient denies chest pain, shortness of breath. Patient has history of appendectomy.  Patient is a 25 y.o. female presenting with abdominal pain. The history is provided by the patient.  Abdominal Pain Associated symptoms: nausea   Associated symptoms: no chest pain, no chills, no dysuria, no fever, no shortness of breath, no sore throat and no vomiting     History reviewed. No pertinent past medical history. Past Surgical History  Procedure Laterality Date  . Appendectomy     Family History  Problem Relation Age of Onset  . Cancer Mother    Social History  Substance Use Topics  . Smoking status: Never Smoker   . Smokeless tobacco: None  . Alcohol Use: Yes     Comment: occassinally   OB History    No data available     Review of Systems  Constitutional: Negative for fever and chills.  HENT: Negative for facial swelling and sore throat.   Respiratory: Negative for shortness of breath.    Cardiovascular: Negative for chest pain.  Gastrointestinal: Positive for nausea and abdominal pain. Negative for vomiting.  Genitourinary: Negative for dysuria.  Musculoskeletal: Negative for back pain.  Skin: Negative for rash and wound.  Neurological: Negative for headaches.  Psychiatric/Behavioral: The patient is not nervous/anxious.       Allergies  Review of patient's allergies indicates no known allergies.  Home Medications   Prior to Admission medications   Medication Sig Start Date End Date Taking? Authorizing Provider  acetaminophen (TYLENOL) 500 MG tablet Take 1,000 mg by mouth every 6 (six) hours as needed for mild pain.   Yes Historical Provider, MD  acidophilus (RISAQUAD) CAPS capsule Take 1 capsule by mouth daily.   Yes Historical Provider, MD  Boric Acid Topical POWD Place 600 mg vaginally at bedtime as needed. Make into capsules for intravaginal use for 21 days in a row 12/14/15  Yes Tonye Pearsonobert P Doolittle, MD  etonogestrel (NEXPLANON) 68 MG IMPL implant 1 each by Subdermal route once.   Yes Historical Provider, MD  Multiple Vitamin (MULTIVITAMIN WITH MINERALS) TABS tablet Take 1 tablet by mouth daily.   Yes Historical Provider, MD  azithromycin (ZITHROMAX) 250 MG tablet As packaged Patient not taking: Reported on 12/29/2015 12/14/15   Tonye Pearsonobert P Doolittle, MD  metroNIDAZOLE (FLAGYL) 500 MG tablet Take 1 tablet (500 mg total) by mouth 2 (two) times daily. Patient not taking: Reported on 12/29/2015 12/14/15   Tonye Pearsonobert P Doolittle, MD  metroNIDAZOLE (METROGEL) 0.75 % vaginal gel Twice weekly for 6 months Patient not taking: Reported  on 12/29/2015 12/14/15   Tonye Pearson, MD   BP 90/46 mmHg  Pulse 73  Temp(Src) 99.1 F (37.3 C) (Oral)  Resp 18  Ht  (1.549 m)  Wt 57.153 kg  BMI 23.82 kg/m2  SpO2 100%  LMP 12/03/2015 Physical Exam  Constitutional: She appears well-developed and well-nourished. No distress.  HENT:  Head: Normocephalic and atraumatic.  Mouth/Throat:  Oropharynx is clear and moist. No oropharyngeal exudate.  Eyes: Conjunctivae are normal. Pupils are equal, round, and reactive to light. Right eye exhibits no discharge. Left eye exhibits no discharge. No scleral icterus.  Neck: Normal range of motion. Neck supple. No thyromegaly present.  Cardiovascular: Normal rate, regular rhythm, normal heart sounds and intact distal pulses.  Exam reveals no gallop and no friction rub.   No murmur heard. Pulmonary/Chest: Effort normal and breath sounds normal. No stridor. No respiratory distress. She has no wheezes. She has no rales.  Abdominal: Soft. Bowel sounds are normal. She exhibits no distension. There is tenderness in the right lower quadrant and suprapubic area. There is no rebound and no guarding.  Genitourinary: Cervix exhibits discharge (brown, blood-like). Cervix exhibits no motion tenderness and no friability. Right adnexum displays no mass, no tenderness and no fullness. Left adnexum displays no mass, no tenderness and no fullness.  Boric acid visualized (last use Sunday evening)  Musculoskeletal: She exhibits no edema.  Lymphadenopathy:    She has no cervical adenopathy.  Neurological: She is alert. Coordination normal.  Skin: Skin is warm and dry. No rash noted. She is not diaphoretic. No pallor.  Psychiatric: She has a normal mood and affect.  Nursing note and vitals reviewed.   ED Course  Procedures (including critical care time) Labs Review Labs Reviewed  WET PREP, GENITAL - Abnormal; Notable for the following:    WBC, Wet Prep HPF POC FEW (*)    All other components within normal limits  COMPREHENSIVE METABOLIC PANEL - Abnormal; Notable for the following:    Glucose, Bld 103 (*)    All other components within normal limits  URINE CULTURE  PREGNANCY, URINE  URINALYSIS, ROUTINE W REFLEX MICROSCOPIC (NOT AT [facility])  CBC WITH DIFFERENTIAL/PLATELET  LIPASE, BLOOD  RPR  HIV ANTIBODY (ROUTINE TESTING)  GC/CHLAMYDIA PROBE AMP ([facility]) NOT AT [facility]    Imaging Review No results found. I have personally reviewed and evaluated these images and lab results as part of my medical decision-making.   EKG Interpretation None      MDM   Patient's pain improved in ED with at home Tylenol. CBC, CMP unremarkable. Lipase 20. Urine pregnancy negative. UA negative. Wet prep shows few WBCs. HIV and RPR sent. Patient advised that she will be made aware in 2-3 days if any of her labs returned positive. Discussed patient with Dr. Cleta Alberts at the patient's PCP office. He would like the patient to follow-up with his office for appropriate referral. Patient discussed with Dr. Mancel Bale who is in agreement with plan. Patient vitals stable and in satisfactory condition at discharge.  Final diagnoses:  Suprapubic pain, acute, unspecified laterality       Emi Holes, PA-C 12/29/15 1002  Mancel Bale, MD 12/29/15 [phone]

## 2015-12-29 NOTE — Telephone Encounter (Signed)
Incoming call from PA Glenford BayleyAlex Law, pt is being evaluated for suprapubic pain.  Work up in the ER unrevealing.  Patient will be advised to return to clinic for reevaluation for possible referral for possible pelvic ultrasound vs referral to GYN or urologist.  Dr. Cleta Albertsaub took the call.

## 2015-12-29 NOTE — ED Notes (Signed)
Pt complains of a severe sharp pain in her lower center abdomen, no vomiting or diarrhea

## 2015-12-30 LAB — URINE CULTURE: Culture: 4000 — AB

## 2018-09-22 ENCOUNTER — Encounter (HOSPITAL_COMMUNITY): Payer: Self-pay | Admitting: Emergency Medicine

## 2018-09-22 ENCOUNTER — Emergency Department (HOSPITAL_COMMUNITY)
Admission: EM | Admit: 2018-09-22 | Discharge: 2018-09-22 | Disposition: A | Discharge status: Home / Self Care | Payer: 59 | Attending: Emergency Medicine | Admitting: Emergency Medicine

## 2018-09-22 ENCOUNTER — Emergency Department (HOSPITAL_COMMUNITY): Payer: 59

## 2018-09-22 DIAGNOSIS — Z79899 Other long term (current) drug therapy: Secondary | ICD-10-CM | POA: Diagnosis not present

## 2018-09-22 DIAGNOSIS — R079 Chest pain, unspecified: Secondary | ICD-10-CM | POA: Diagnosis present

## 2018-09-22 HISTORY — DX: Disorder of thyroid, unspecified: E07.9

## 2018-09-22 LAB — D-DIMER, QUANTITATIVE: D-Dimer, Quant: 1.1 ug/mL-FEU — ABNORMAL HIGH (ref 0.00–0.50)

## 2018-09-22 LAB — BASIC METABOLIC PANEL
ANION GAP: 12 (ref 5–15)
BUN: 8 mg/dL (ref 6–20)
CALCIUM: 9.5 mg/dL (ref 8.9–10.3)
CHLORIDE: 106 mmol/L (ref 98–111)
CO2: 21 mmol/L — ABNORMAL LOW (ref 22–32)
Creatinine, Ser: 0.9 mg/dL (ref 0.44–1.00)
GFR calc Af Amer: >60 mL/min (ref >60)
GLUCOSE: 92 mg/dL (ref 70–99)
POTASSIUM: 4.4 mmol/L (ref 3.5–5.1)
Sodium: 139 mmol/L (ref 135–145)

## 2018-09-22 LAB — I-STAT BETA HCG BLOOD, ED (MC, WL, AP ONLY): I-stat hCG, quantitative: <5 m[IU]/mL (ref <5)

## 2018-09-22 LAB — CBC
HCT: 43.2 % (ref 36.0–46.0)
Hemoglobin: 13.9 g/dL (ref 12.0–15.0)
MCH: 28.4 pg (ref 26.0–34.0)
MCHC: 32.2 g/dL (ref 30.0–36.0)
MCV: 88.3 fL (ref 80.0–100.0)
Platelets: 370 10*3/uL (ref 150–400)
RBC: 4.89 MIL/uL (ref 3.87–5.11)
RDW: 13.2 % (ref 11.5–15.5)
WBC: 5.9 10*3/uL (ref 4.0–10.5)
nRBC: 0 % (ref 0.0–0.2)

## 2018-09-22 LAB — I-STAT TROPONIN, ED
Troponin i, poc: 0.01 ng/mL (ref 0.00–0.08)
Troponin i, poc: 0 ng/mL (ref 0.00–0.08)

## 2018-09-22 MED ORDER — SODIUM CHLORIDE 0.9% FLUSH: 3.0000 mL | Freq: Once | INTRAVENOUS | Status: DC

## 2018-09-22 MED ORDER — IOPAMIDOL (ISOVUE-370) INJECTION 76%
100.0000 mL | Freq: Once | INTRAVENOUS | Status: AC | PRN
  Administered 2018-09-22: 100 mL via INTRAVENOUS

## 2018-09-22 NOTE — ED Triage Notes (Signed)
Pt reports left sided chest pain X3 days that is getting progressively worse.  Worse when reclining.  Nausea and weakness as well

## 2018-09-22 NOTE — Discharge Instructions (Addendum)
It was my pleasure taking care of you today!   You were seen in the Emergency Department today for chest pain.  As we have discussed, todays blood work and imaging are normal, but you may require further testing.  Please call your primary care physician to schedule a follow up appointment to discuss your ER visit today. If you do not have one, please see the information below.   Return to the Emergency Department if you experience new or worsening symptoms, any other symptoms that concern you.  To find a primary care or specialty doctor please call [phone] or [phone] to access "[facility] Find a Doctor Service."  You may also go on the [facility] website at [email or web address]  There are also multiple [facility] and [facility] practices throughout the [place] that are frequently accepting new patients. You may find a clinic that is close to your home and contact them.  [facility] - [address] [place] [address] [phone]  [facility] in [place] (also locations in [place] and [place]) - [address] [employer] [employer] [place] [phone]  [facility] - [address] [place] [phone]

## 2018-09-22 NOTE — ED Provider Notes (Signed)
[facility] EMERGENCY [facility] Provider Note   CSN: [id number] Arrival date & time: 09/22/18  0148     History   Chief Complaint Chief Complaint  Patient presents with  . Chest Pain    HPI Belinda Snyder is a 28 y.o. female.  The history is provided by the patient and medical records. No language interpreter was used.  Chest Pain  Associated symptoms: nausea   Associated symptoms: no abdominal pain, no dizziness, no headache, no numbness, no palpitations, no vomiting and no weakness     Belinda Snyder is a 28 y.o. female  with a PMH of thyroid disease who presents to the Emergency Department complaining of sharp, left-sided nonradiating chest pain which began about 3 days ago.  Pain will last about 10 minutes, then slowly fade away.  Denies any exertional component.  Feels as if taking deep breaths actually improve her symptoms.  Associated with nausea, no vomiting.  She reports feeling lightheaded sometimes when she is walking, but this does not exacerbate her chest pain.  She denies a personal or family cardiac history.  No history of hypertension, hld, dm.  On Depo injections for birth control.  She works as a Tax advisersales rep, driving couple hours each day.  She did recently have a long trip to [place] the last week of December.  She denies any lower extremity swelling or pain.   Past Medical History:  Diagnosis Date  . Thyroid disease     Patient Active Problem List   Diagnosis Date Noted  . 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 Yeast vaginitis 05/19/2015    Past Surgical History:  Procedure Laterality Date  . APPENDECTOMY       OB History   No obstetric history on file.      Home Medications    Prior to Admission medications   Medication Sig Start Date End Date Taking? Authorizing Provider  acetaminophen (TYLENOL) 500 MG tablet Take 1,000 mg by mouth every 6 (six) hours as needed for mild pain.   Yes [provider]    folic acid (FOLVITE) 1 MG tablet Take 1 mg by mouth daily.   Yes [provider]  levothyroxine (SYNTHROID, LEVOTHROID) 50 MCG tablet Take 50 mcg by mouth daily before breakfast.   Yes [provider]  azithromycin (ZITHROMAX) 250 MG tablet As packaged Patient not taking: Reported on 12/29/2015 12/14/15   Tonye Pearsonoolittle, Robert P, MD  Boric Acid Topical POWD Place 600 mg vaginally at bedtime as needed. Make into capsules for intravaginal use for 21 days in a row Patient not taking: Reported on 09/22/2018 12/14/15   Tonye Pearsonoolittle, Robert P, MD  metroNIDAZOLE (FLAGYL) 500 MG tablet Take 1 tablet (500 mg total) by mouth 2 (two) times daily. Patient not taking: Reported on 12/29/2015 12/14/15   Tonye Pearsonoolittle, Robert P, MD  metroNIDAZOLE (METROGEL) 0.75 % vaginal gel Twice weekly for 6 months Patient not taking: Reported on 12/29/2015 12/14/15   Tonye Pearsonoolittle, Robert P, MD    Family History Family History  Problem Relation Age of Onset  . Cancer Mother     Social History Social History   Tobacco Use  . Smoking status: Never Smoker  Substance Use Topics  . Alcohol use: Yes    Comment: occassinally  . Drug use: No     Allergies   Amoxicillin   Review of Systems Review of Systems  Cardiovascular: Positive for chest pain. Negative for palpitations and leg swelling.  Gastrointestinal: Positive for nausea. Negative for abdominal  pain, constipation, diarrhea and vomiting.  Neurological: Positive for light-headedness. Negative for dizziness, syncope, weakness, numbness and headaches.     Physical Exam Updated Vital Signs BP 114/75   Pulse 74   Temp 98 F (36.7 C) (Oral)   Resp 14   SpO2 99%   Physical Exam Vitals signs and nursing note reviewed.  Constitutional:      General: She is not in acute distress.    Appearance: She is well-developed.  HENT:     Head: Normocephalic and atraumatic.  Neck:     Musculoskeletal: Neck supple.  Cardiovascular:     Rate and Rhythm: Normal  rate and regular rhythm.     Heart sounds: Normal heart sounds. No murmur.  Pulmonary:     Effort: Pulmonary effort is normal. No respiratory distress.     Breath sounds: Normal breath sounds.  Chest:    Abdominal:     General: There is no distension.     Palpations: Abdomen is soft.     Tenderness: There is no abdominal tenderness.  Musculoskeletal:     Comments: No lower extremity edema or calf tenderness.  Skin:    General: Skin is warm and dry.  Neurological:     Mental Status: She is alert and oriented to person, place, and time.      ED Treatments / Results  Labs (all labs ordered are listed, but only abnormal results are displayed) Labs Reviewed  BASIC METABOLIC PANEL - Abnormal; Notable for the following components:      Result Value   CO2 21 (*)    All other components within normal limits  D-DIMER, QUANTITATIVE (NOT AT [facility]) - Abnormal; Notable for the following components:   D-Dimer, Quant 1.10 (*)    All other components within normal limits  CBC  I-STAT TROPONIN, ED  I-STAT BETA HCG BLOOD, ED ([facility], [facility], [facility] ONLY)  I-STAT TROPONIN, ED    EKG EKG Interpretation  Date/Time:  Saturday September 22 2018 01:56:53 EST Ventricular Rate:  88 PR Interval:  134 QRS Duration: 64 QT Interval:  364 QTC Calculation: 440 R Axis:   42 Text Interpretation:  Normal sinus rhythm Normal ECG No STEMI No old tracing to compare Confirmed by Drema Pryardama, Pedro [phone]) on 09/22/2018 5:41:06 AM   Radiology Dg Chest 2 View  Result Date: 09/22/2018 CLINICAL DATA:  Chest pain. EXAM: CHEST - 2 VIEW COMPARISON:  10/10/2011 FINDINGS: Midline trachea.  Normal heart size and mediastinal contours. Sharp costophrenic angles.  No pneumothorax.  Clear lungs. IMPRESSION: No active cardiopulmonary disease. Electronically Signed   By: Jeronimo GreavesKyle  Talbot M.D.   On: 09/22/2018 03:07   Ct Angio Chest Pe W And/or Wo Contrast  Result Date: 09/22/2018 CLINICAL DATA:  Chest pain.  Elevated D-dimer EXAM: CT  ANGIOGRAPHY CHEST WITH CONTRAST TECHNIQUE: Multidetector CT imaging of the chest was performed using the standard protocol during bolus administration of intravenous contrast. Multiplanar CT image reconstructions and MIPs were obtained to evaluate the vascular anatomy. CONTRAST:  126 mL ISOVUE-370 IOPAMIDOL (ISOVUE-370) INJECTION 76% COMPARISON:  Chest radiograph September 22, 2018 FINDINGS: Cardiovascular: There is no demonstrable pulmonary embolus. There is no thoracic aortic aneurysm or dissection. Visualized great vessels appear normal. There is no pericardial effusion or pericardial thickening. Mediastinum/Nodes: Visualized thyroid appears normal. There is no appreciable thoracic adenopathy. No esophageal lesions are evident. Lungs/Pleura: There is no appreciable edema or consolidation. There is no appreciable pleural effusion or pleural thickening. Upper Abdomen: Visualized upper abdominal structures appear unremarkable. Musculoskeletal:  There are no blastic or lytic bone lesions. No chest wall lesions are appreciable. Review of the MIP images confirms the above findings. IMPRESSION: 1. No demonstrable pulmonary embolus. No thoracic aortic aneurysm or dissection. 2.  No lung edema or consolidation.  No pleural effusion. 3.  No demonstrable thoracic adenopathy. Electronically Signed   By: Bretta Bang III M.D.   On: 09/22/2018 08:28    Procedures Procedures (including critical care time)  Medications Ordered in ED Medications  sodium chloride flush (NS) 0.9 % injection 3 mL (3 mLs Intravenous Not Given 09/22/18 0734)  iopamidol (ISOVUE-370) 76 % injection 100 mL (100 mLs Intravenous Contrast Given 09/22/18 0751)     Initial Impression / Assessment and Plan / ED Course  I have reviewed the triage vital signs and the nursing notes.  Pertinent labs & imaging results that were available during my care of the patient were reviewed by me and considered in my medical decision making (see chart for  details).    Exer Gromley is a 28 y.o. female who presents to ED for chest pain x 3 days associated with nausea. On exam, patient tender to the central chest, but otherwise normal cardiopulmonary examination.   Negative troponin x2.CXR with no acute abnormalities.  EKG non-ischemic.  Low risk heart score of 1.  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   CT angio without acute findings. No PE.    Patient's symptoms unlikely to be of cardiac etiology. Labs and imaging reviewed again prior to discharge. Patient has been advised to return to the ED if development of any exertional chest pain, trouble breathing, new/worsening symptoms or for any additional concerns. Evaluation does not show pathology that would require ongoing emergent intervention or inpatient treatment. Encouraged to follow up with PCP. Patient understands return precautions and follow up plan. All questions answered.   Final Clinical Impressions(s) / ED Diagnoses   Final diagnoses:  Chest pain with low risk for cardiac etiology    ED Discharge Orders    None       Kemoni Quesenberry, Chase Picket, PA-C 09/22/18 4239    Tilden Fossa, MD 09/22/18 1154

## 2019-05-19 IMAGING — CR DG CHEST 2V
2 series · 2 of 2 positions shown · non-contrast
Comparison: 10/10/2011

CLINICAL DATA: Chest pain.

EXAM:
CHEST - 2 VIEW

[chest pa]
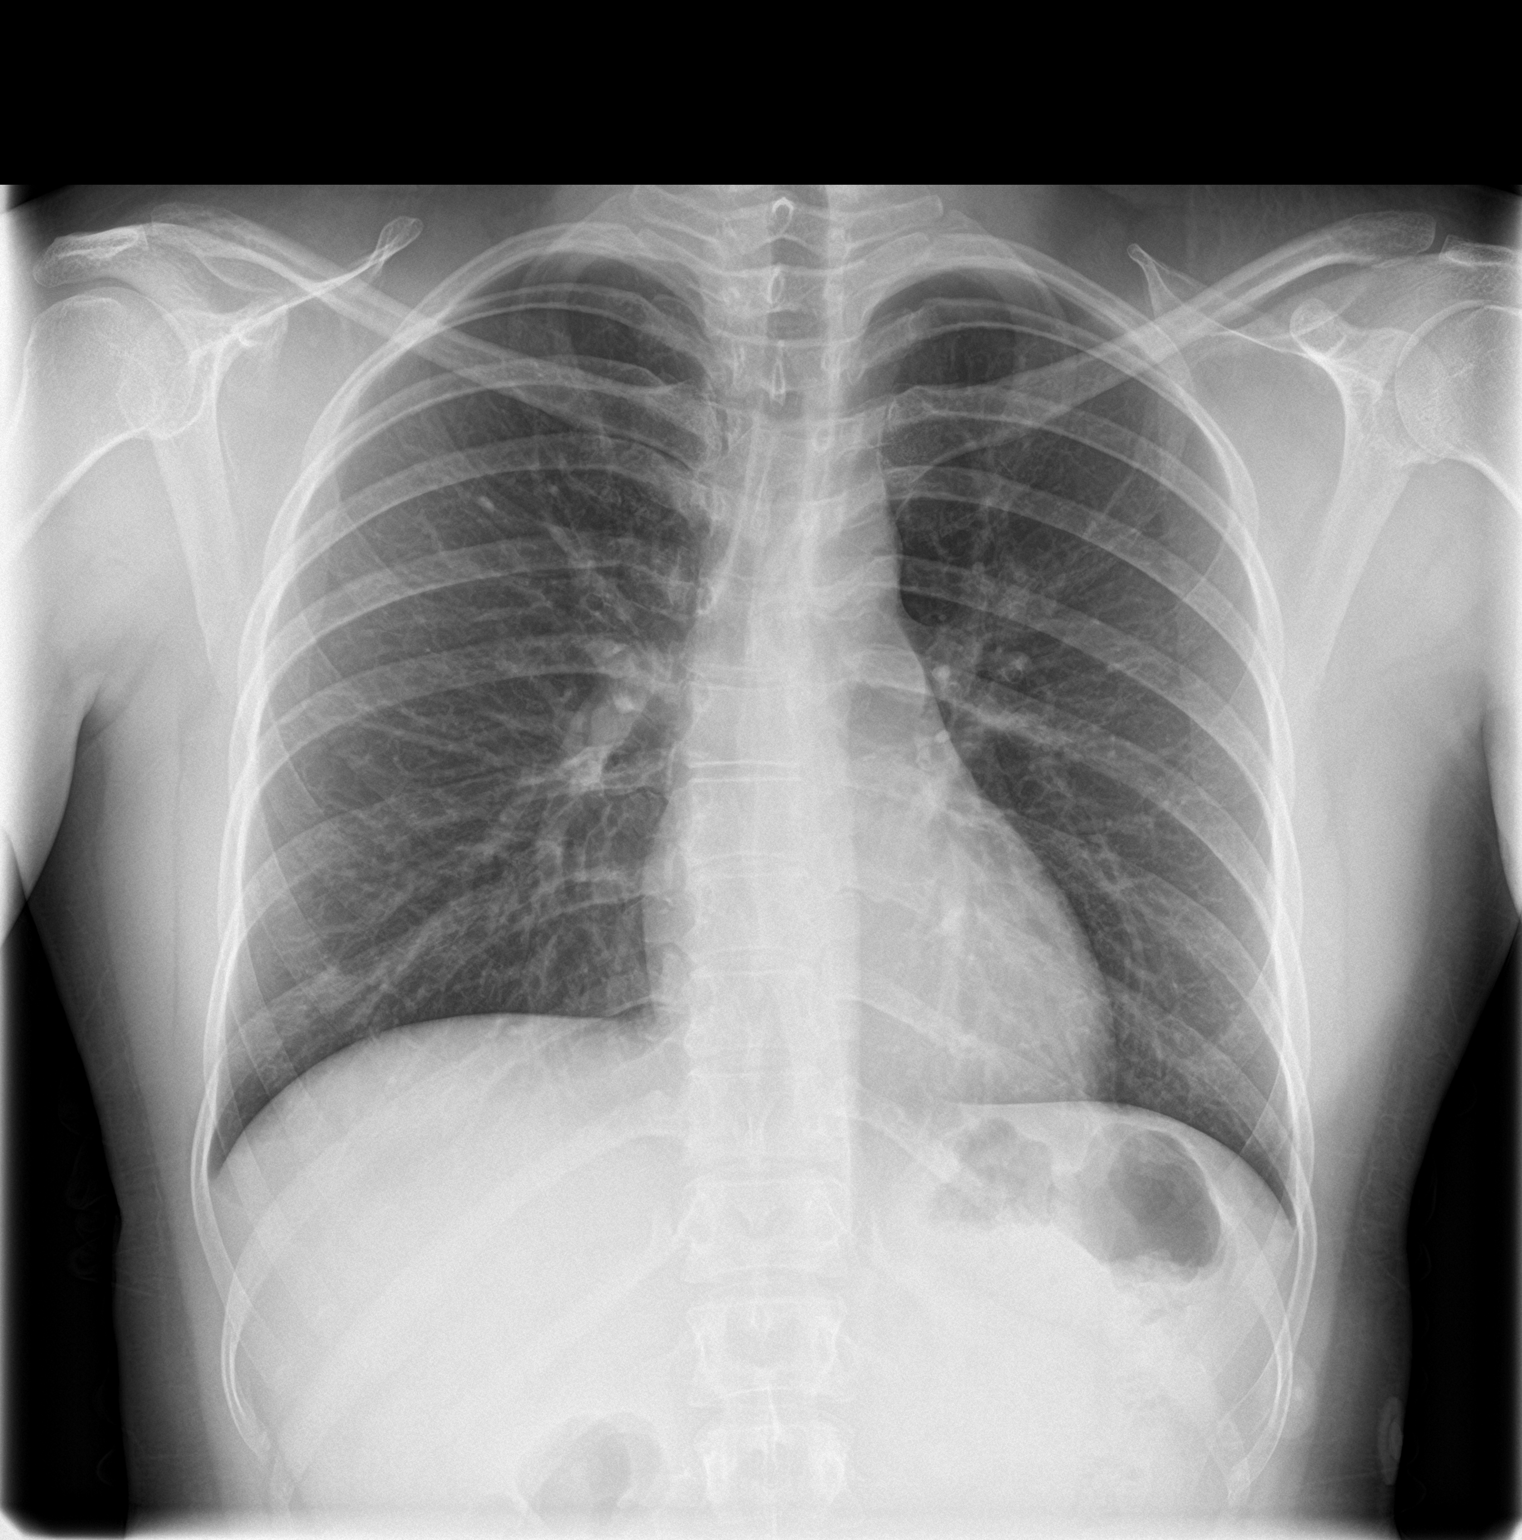

[chest lat]
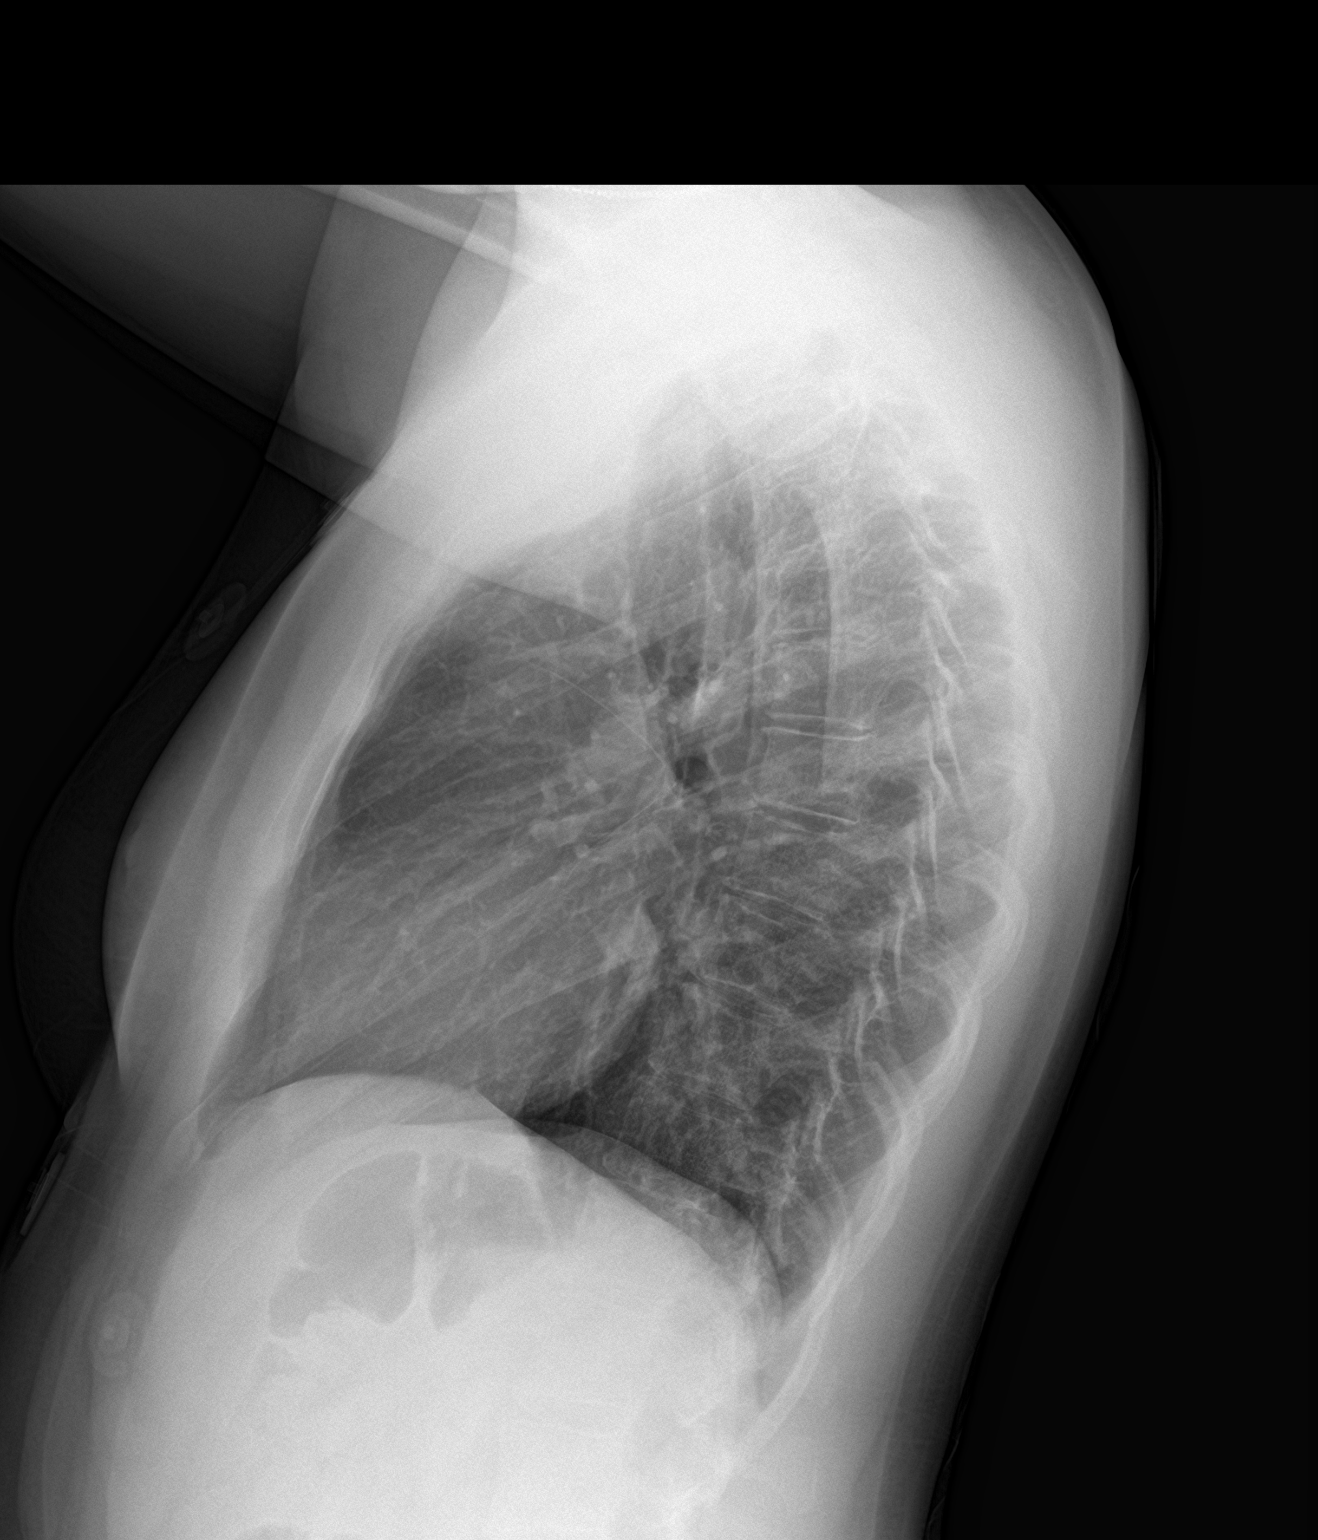

[2 of 2 positions shown; findings below may reference images not displayed]

FINDINGS: Midline trachea.  Normal heart size and mediastinal contours.

Sharp costophrenic angles.  No pneumothorax.  Clear lungs.
IMPRESSION: No active cardiopulmonary disease.

## 2021-07-02 ENCOUNTER — Ambulatory Visit
Admission: EM | Admit: 2021-07-02 | Discharge: 2021-07-02 | Disposition: A | Discharge status: Home / Self Care | Payer: BC Managed Care – PPO | Attending: Emergency Medicine | Admitting: Emergency Medicine

## 2021-07-02 ENCOUNTER — Other Ambulatory Visit: Payer: Self-pay

## 2021-07-02 DIAGNOSIS — B3731 Acute candidiasis of vulva and vagina: Secondary | ICD-10-CM

## 2021-07-02 DIAGNOSIS — J3089 Other allergic rhinitis: Secondary | ICD-10-CM

## 2021-07-02 DIAGNOSIS — R0981 Nasal congestion: Secondary | ICD-10-CM

## 2021-07-02 DIAGNOSIS — J329 Chronic sinusitis, unspecified: Secondary | ICD-10-CM

## 2021-07-02 DIAGNOSIS — T485X5A Adverse effect of other anti-common-cold drugs, initial encounter: Secondary | ICD-10-CM

## 2021-07-02 MED ORDER — IPRATROPIUM BROMIDE 0.03 % NA SOLN: 2.0000 | Freq: Two times a day (BID) | NASAL | 0 refills | Status: AC

## 2021-07-02 MED ORDER — METHYLPREDNISOLONE 4 MG PO TBPK: ORAL_TABLET | ORAL | 0 refills | Status: AC

## 2021-07-02 MED ORDER — FLUTICASONE PROPIONATE 50 MCG/ACT NA SUSP: 2.0000 | Freq: Every day | NASAL | 0 refills | Status: AC

## 2021-07-02 MED ORDER — DOXYCYCLINE HYCLATE 100 MG PO CAPS: 100.0000 mg | ORAL_CAPSULE | Freq: Two times a day (BID) | ORAL | 0 refills | Status: AC

## 2021-07-02 MED ORDER — METHYLPREDNISOLONE SODIUM SUCC 125 MG IJ SOLR
125.0000 mg | Freq: Once | INTRAMUSCULAR | Status: AC
  Administered 2021-07-02: 125 mg via INTRAMUSCULAR

## 2021-07-02 MED ORDER — CETIRIZINE HCL 10 MG PO TABS: 10.0000 mg | ORAL_TABLET | Freq: Every day | ORAL | 0 refills | Status: AC

## 2021-07-02 MED ORDER — FLUCONAZOLE 150 MG PO TABS: ORAL_TABLET | ORAL | 0 refills | Status: AC

## 2021-07-02 NOTE — Discharge Instructions (Addendum)
You received an injection of methylprednisolone today which should give you significant relief of your congestion in the next 12 to 24 hours.  Please follow this up with a 6-day course of methylprednisolone taken orally, take 1 row of tablets daily until complete.  For your allergic rhinitis, please discontinue Afrin nasal spray.  Begin Flonase steroid nasal spray, Atrovent nasal spray which is used for patients suffering from severe runny nose, and cetirizine which is a antihistamine tablet to take daily at bedtime.  I have also provided you with a prescription of doxycycline that you can fill at your discretion, additionally there is a prescription for Diflucan for treatment of vulvovaginal candidiasis should the doxycycline cause you to have yeast infection.  If you do not have complete resolution of your symptoms in the next 10 days, please come back for follow-up evaluation.  Thank you for visiting urgent care today, I hope you feel better soon.

## 2021-07-02 NOTE — ED Provider Notes (Signed)
[facility]    CSN: [id number] Arrival date & time: 07/02/21  1405      History   Chief Complaint Chief Complaint  Patient presents with   Facial Pain    HPI Belinda Snyder is a 30 y.o. female.   Patient complains of sinus infection.  States she gets sinus infections several times a year, states her provider always gives her a prescription for doxycycline 7 to 10 days which always causes her to have a vaginal yeast infection.  Patient states she is been told she has seasonal allergies however she does not currently take any medication for this.  Patient states she is been prescribed Flonase in the past, states she does not feel clearly helps her.  Patient states for her current episode, she has been taking Sudafed and using Afrin nasal spray.  Patient states that she is having difficulty breathing through her nose, has "intense" pain below both of her eyes, severe pain that radiates to her teeth when she has to lean her head forward, states her symptoms with this episode are similar to previous episode.  Patient denies fever, aches, chills, anosmia, ageusia, sore throat, body aches, chills.  The history is provided by the patient.   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      Home Medications    Prior to Admission medications   Medication Sig Start Date End Date Taking? Authorizing Provider  cetirizine (ZYRTEC ALLERGY) 10 MG tablet Take 1 tablet (10 mg total) by mouth at bedtime. 07/02/21 08/01/21 Yes Theadora Rama Scales, PA-C  doxycycline (VIBRAMYCIN) 100 MG capsule Take 1 capsule (100 mg total) by mouth 2 (two) times daily. 07/02/21  Yes Theadora Rama Scales, PA-C  fluconazole (DIFLUCAN) 150 MG  tablet Take 1 tablet today.  Take second tablet 3 days later. 07/02/21  Yes Theadora Rama Scales, PA-C  fluticasone (FLONASE) 50 MCG/ACT nasal spray Place 2 sprays into both nostrils daily. 07/02/21 08/01/21 Yes Theadora Rama Scales, PA-C  ipratropium (ATROVENT) 0.03 % nasal spray Place 2 sprays into both nostrils every 12 (twelve) hours. 07/02/21  Yes Theadora Rama Scales, PA-C  methylPREDNISolone (MEDROL DOSEPAK) 4 MG TBPK tablet Take 24 mg on day 1, 20 mg on day 2, 16 mg on day 3, 12 mg on day 4, 8 mg on day 5, 4 mg on day 6. 07/02/21  Yes Theadora Rama Scales, PA-C  acetaminophen (TYLENOL) 500 MG tablet Take 1,000 mg by mouth every 6 (six) hours as needed for mild pain.    [provider]  folic acid (FOLVITE) 1 MG tablet Take 1 mg by mouth daily.    [provider]  levothyroxine (SYNTHROID, LEVOTHROID) 50 MCG tablet Take 50 mcg by mouth daily before breakfast.    [provider]    Family History Family History  Problem Relation Age of Onset   Cancer Mother     Social History Social History   Tobacco Use   Smoking status: Never  Substance Use Topics   Alcohol use: Yes    Comment: occassinally   Drug use: No     Allergies   Amoxicillin   Review of Systems Review of Systems Pertinent findings  noted in history of present illness.    Physical Exam Triage Vital Signs ED Triage Vitals  Enc Vitals Group     BP 07/02/21 0827 (!) 147/82     Pulse Rate 07/02/21 0827 72     Resp 07/02/21 0827 18     Temp 07/02/21 0827 98.3 F (36.8 C)     Temp Source 07/02/21 0827 Oral     SpO2 07/02/21 0827 98 %     Weight --      Height --      Head Circumference --      Peak Flow --      Pain Score 07/02/21 0826 5     Pain Loc --      Pain Edu? --      Excl. in GC? --    No data found.  Updated Vital Signs BP 114/77 (BP Location: Left Arm)   Pulse 81   Temp 98.3 F (36.8 C) (Oral)   Resp 18   LMP 06/10/2021   SpO2 98%   Visual  Acuity Right Eye Distance:   Left Eye Distance:   Bilateral Distance:    Right Eye Near:   Left Eye Near:    Bilateral Near:     Physical Exam Vitals and nursing note reviewed.  Constitutional:      General: She is not in acute distress.    Appearance: Normal appearance. She is not ill-appearing.  HENT:     Head: Normocephalic and atraumatic.     Salivary Glands: Right salivary gland is not diffusely enlarged or tender. Left salivary gland is not diffusely enlarged or tender.     Right Ear: Tympanic membrane, ear canal and external ear normal. No drainage. No middle ear effusion. There is no impacted cerumen. Tympanic membrane is not erythematous or bulging.     Left Ear: Tympanic membrane, ear canal and external ear normal. No drainage.  No middle ear effusion. There is no impacted cerumen. Tympanic membrane is not erythematous or bulging.     Nose: Congestion and rhinorrhea present. No nasal deformity, septal deviation or mucosal edema.     Right Turbinates: Not enlarged, swollen or pale.     Left Turbinates: Not enlarged, swollen or pale.     Right Sinus: No maxillary sinus tenderness or frontal sinus tenderness.     Left Sinus: No maxillary sinus tenderness or frontal sinus tenderness.     Comments: Patient has severe mucosal edema in both nares with rhinorrhea, unable to completely visualize her nasal passage in her right nare at all.    Mouth/Throat:     Lips: Pink. No lesions.     Mouth: Mucous membranes are moist. No oral lesions.     Pharynx: Oropharynx is clear. Uvula midline. No posterior oropharyngeal erythema or uvula swelling.     Tonsils: No tonsillar exudate. 0 on the right. 0 on the left.  Eyes:     General: Lids are normal.        Right eye: No discharge.        Left eye: No discharge.     Extraocular Movements: Extraocular movements intact.     Conjunctiva/sclera: Conjunctivae normal.     Right eye: Right conjunctiva is not injected.     Left eye: Left  conjunctiva is not injected.  Neck:     Trachea: Trachea and phonation normal.  Cardiovascular:     Rate and Rhythm: Normal rate and regular rhythm.     Pulses: Normal  pulses.     Heart sounds: Normal heart sounds. No murmur heard.   No friction rub. No gallop.  Pulmonary:     Effort: Pulmonary effort is normal. No accessory muscle usage, prolonged expiration or respiratory distress.     Breath sounds: Normal breath sounds. No stridor, decreased air movement or transmitted upper airway sounds. No decreased breath sounds, wheezing, rhonchi or rales.  Chest:     Chest wall: No tenderness.  Musculoskeletal:        General: Normal range of motion.     Cervical back: Normal range of motion and neck supple. Normal range of motion.  Lymphadenopathy:     Cervical: No cervical adenopathy.  Skin:    General: Skin is warm and dry.     Findings: No erythema or rash.  Neurological:     General: No focal deficit present.     Mental Status: She is alert and oriented to person, place, and time.  Psychiatric:        Mood and Affect: Mood normal.        Behavior: Behavior normal.     UC Treatments / Results  Labs (all labs ordered are listed, but only abnormal results are displayed) Labs Reviewed - No data to display  EKG   Radiology No results found.  Procedures Procedures (including critical care time)  Medications Ordered in UC Medications  methylPREDNISolone sodium succinate (SOLU-MEDROL) 125 mg/2 mL injection 125 mg (125 mg Intramuscular Given 07/02/21 1547)    Initial Impression / Assessment and Plan / UC Course  I have reviewed the triage vital signs and the nursing notes.  Pertinent labs & imaging results that were available during my care of the patient were reviewed by me and considered in my medical decision making (see chart for details).     Acute sinusitis secondary to uncontrolled allergic rhinitis.  Patient provided with steroid injection to relieve her very  significant congestion along with a 6-day course of methylprednisolone tapering dose.  I advised her to discontinue Afrin nasal spray and to only use this for nosebleeds.  Begin nasal steroid along with Atrovent to significantly reduce postnasal drip.  Have also recommended cetirizine.  At her discretion, she is welcome to begin a short course of doxycycline for possible bacterial sinusitis given duration of symptoms and the severity of her presentation.  Patient states that doxycycline always gives her vulvovaginal candidiasis so I provided her with an optional prescription for Diflucan as well.  Patient verbalized understanding and agreement of plan as discussed.  All questions were addressed during visit.  Please see discharge instructions below for further details of plan.  Final Clinical Impressions(s) / UC Diagnoses   Final diagnoses:  Sinusitis, unspecified chronicity, unspecified location  Nasal congestion due to prolonged use of decongestants  Allergic rhinitis due to other allergic trigger, unspecified seasonality  Vulvovaginal candidiasis     Discharge Instructions      You received an injection of methylprednisolone today which should give you significant relief of your congestion in the next 12 to 24 hours.  Please follow this up with a 6-day course of methylprednisolone taken orally, take 1 row of tablets daily until complete.  For your allergic rhinitis, please discontinue Afrin nasal spray.  Begin Flonase steroid nasal spray, Atrovent nasal spray which is used for patients suffering from severe runny nose, and cetirizine which is a antihistamine tablet to take daily at bedtime.  I have also provided you with a prescription of doxycycline that  you can fill at your discretion, additionally there is a prescription for Diflucan for treatment of vulvovaginal candidiasis should the doxycycline cause you to have yeast infection.  If you do not have complete resolution of your symptoms  in the next 10 days, please come back for follow-up evaluation.  Thank you for visiting urgent care today, I hope you feel better soon.     ED Prescriptions     Medication Sig Dispense Auth. Provider   fluticasone (FLONASE) 50 MCG/ACT nasal spray Place 2 sprays into both nostrils daily. 18 mL Theadora Rama Scales, PA-C   ipratropium (ATROVENT) 0.03 % nasal spray Place 2 sprays into both nostrils every 12 (twelve) hours. 30 mL Theadora Rama Scales, PA-C   cetirizine (ZYRTEC ALLERGY) 10 MG tablet Take 1 tablet (10 mg total) by mouth at bedtime. 30 tablet Theadora Rama Scales, PA-C   methylPREDNISolone (MEDROL DOSEPAK) 4 MG TBPK tablet Take 24 mg on day 1, 20 mg on day 2, 16 mg on day 3, 12 mg on day 4, 8 mg on day 5, 4 mg on day 6. 21 tablet Theadora Rama Scales, PA-C   fluconazole (DIFLUCAN) 150 MG tablet Take 1 tablet today.  Take second tablet 3 days later. 2 tablet Theadora Rama Scales, PA-C   doxycycline (VIBRAMYCIN) 100 MG capsule Take 1 capsule (100 mg total) by mouth 2 (two) times daily. 20 capsule Theadora Rama Scales, PA-C      PDMP not reviewed this encounter.    Theadora Rama Scales, [place] 07/05/21 [id number]

## 2021-07-02 NOTE — ED Triage Notes (Signed)
Pt reports she has a sinus infection.
